# Patient Record
Sex: Male | Born: 1985 | Race: White | Hispanic: No | Marital: Single | Smoking: Current every day smoker
Health system: Southern US, Community
[De-identification: ages and names within clinical notes are randomized; demographics above are authoritative.]

## PROBLEM LIST (undated history)

## (undated) DIAGNOSIS — R000 Tachycardia, unspecified: Secondary | ICD-10-CM

## (undated) HISTORY — DX: Tachycardia, unspecified: R00.0

---
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
## 2011-06-27 ENCOUNTER — Ambulatory Visit: Payer: Self-pay

## 2012-02-17 IMAGING — US US PELVIS LIMITED
1 series · 18 of 25 positions shown · non-contrast
Comparison: none

REASON FOR EXAM: CR [id number] left sided pain and swelling
COMMENTS:

PROCEDURE:     US  - US TESTICULAR  - June 27, 2011  [DATE]
RESULT:     Right testicle measures 3.9 cm. Left testicle measures 4 cm.
Normal bilateral testicular flow is present. No evidence of testicular mass.
Mild left epididymis enlargement noted.

[Series 1: us pelvis limited · 18 of 48 slices shown]
[im 1/48]
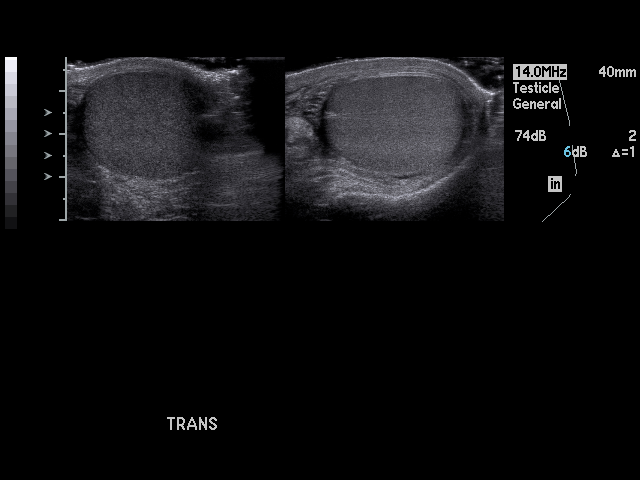
[im 4/48]
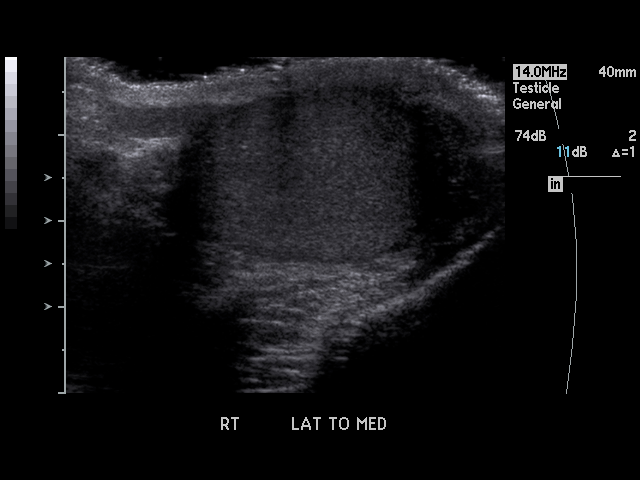
[im 6/48]
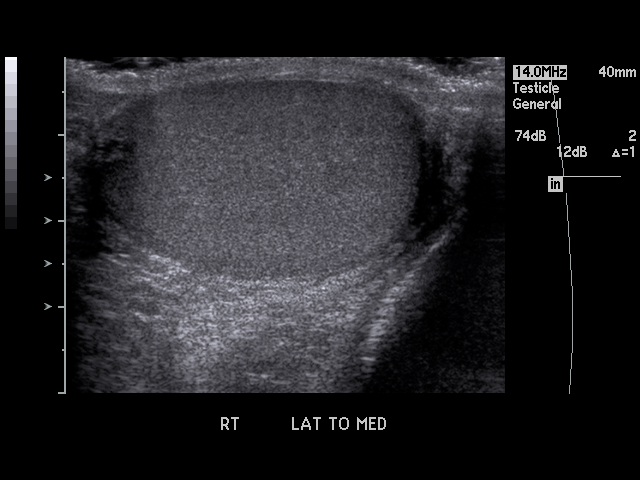
[im 8/48]
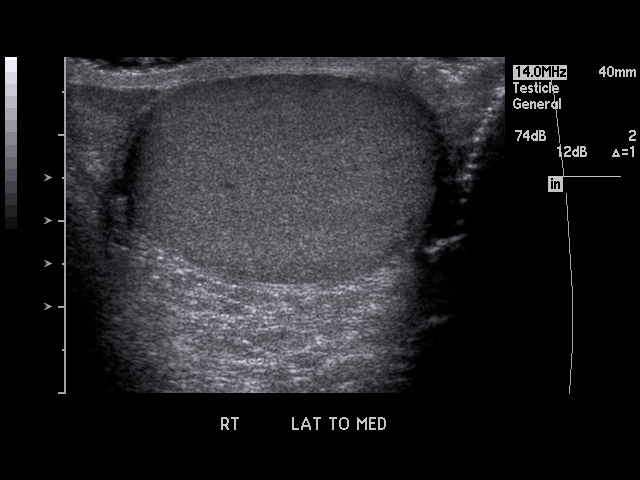
[im 12/48]
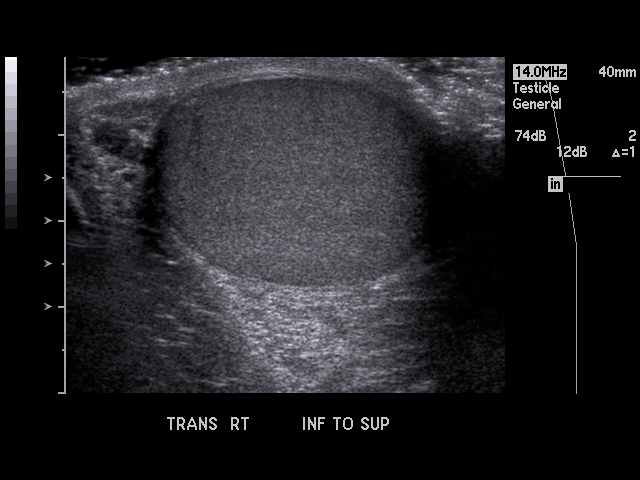
[im 14/48]
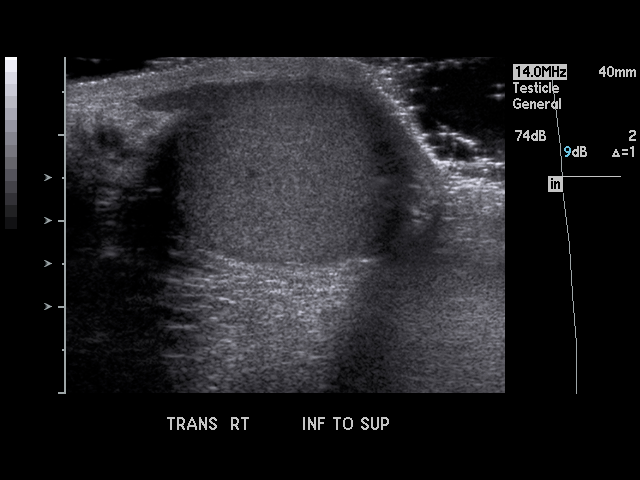
[im 18/48]
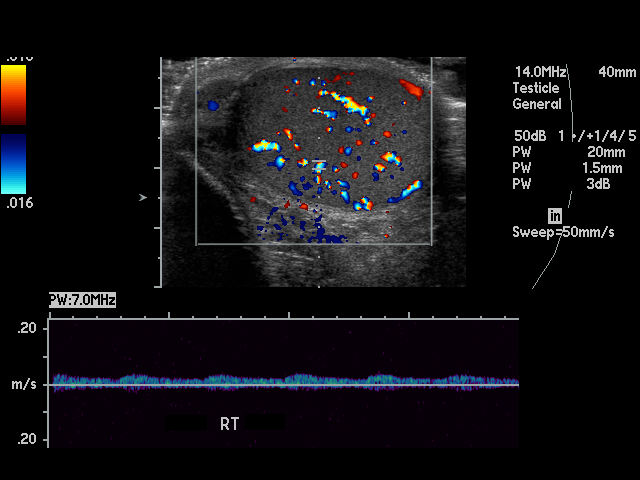
[im 20/48]
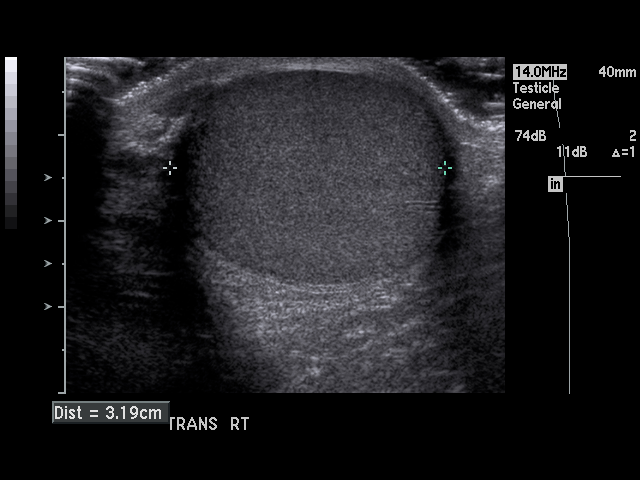
[im 22/48]
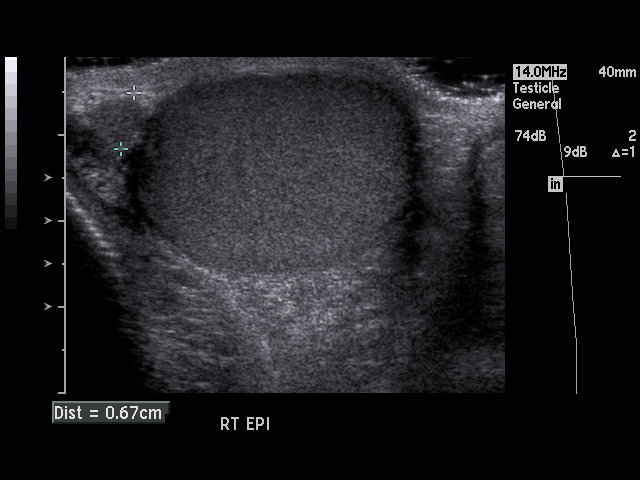
[im 26/48]
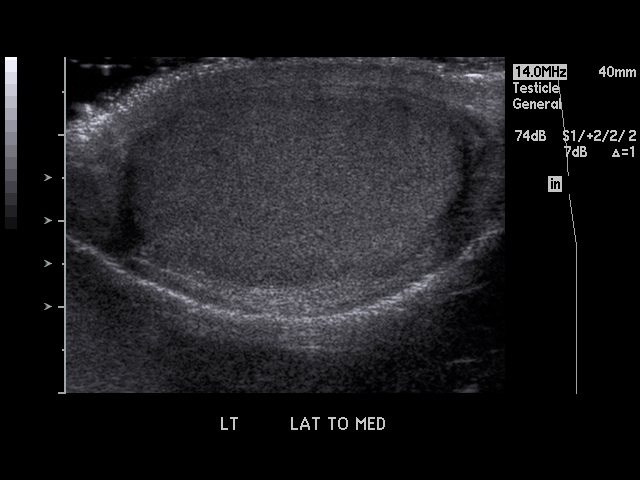
[im 28/48]
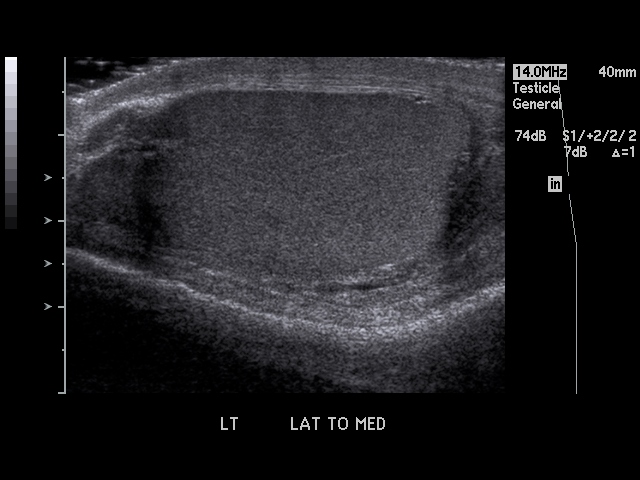
[im 30/48]
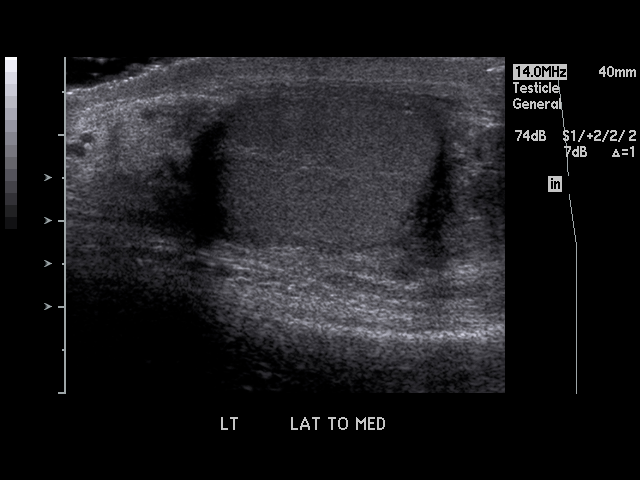
[im 34/48]
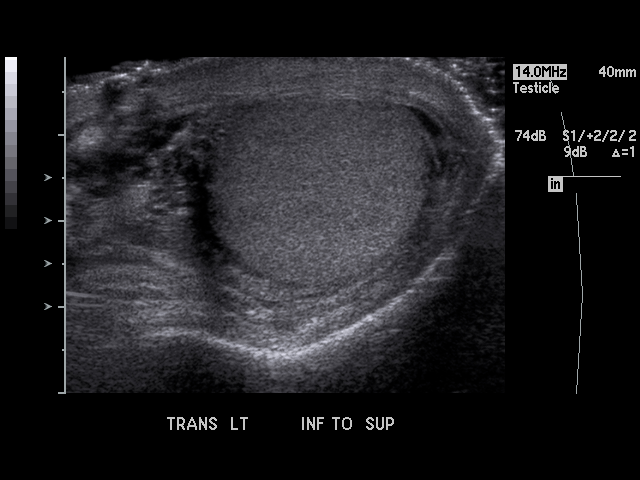
[im 36/48]
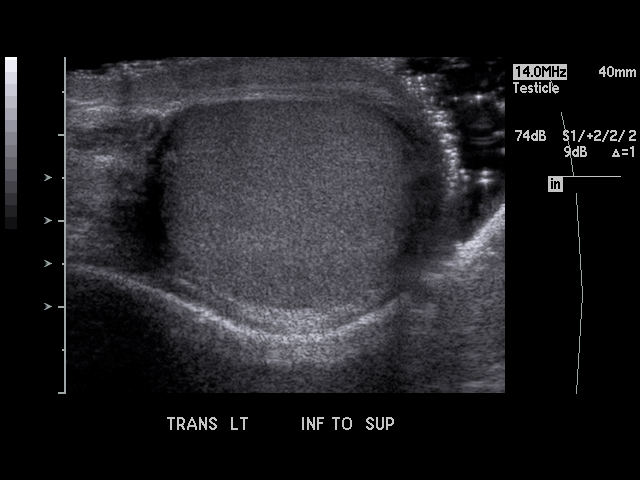
[im 40/48]
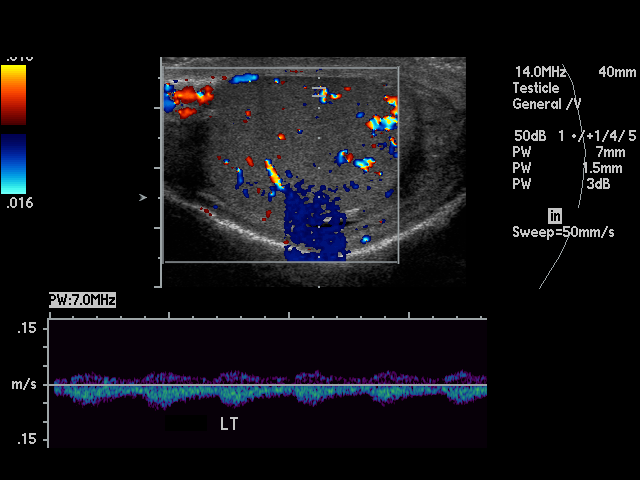
[im 42/48]
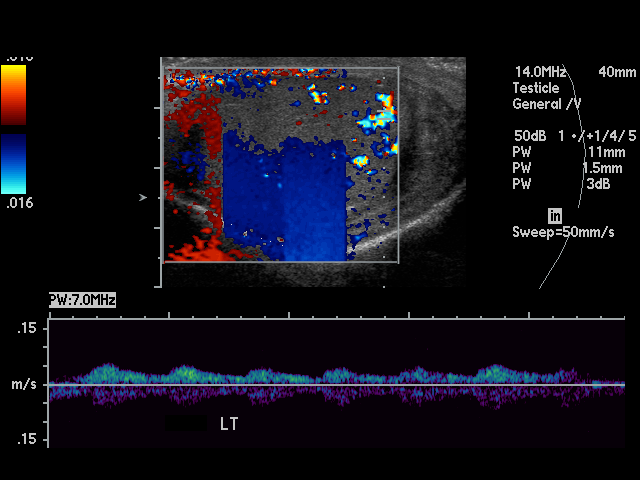
[im 44/48]
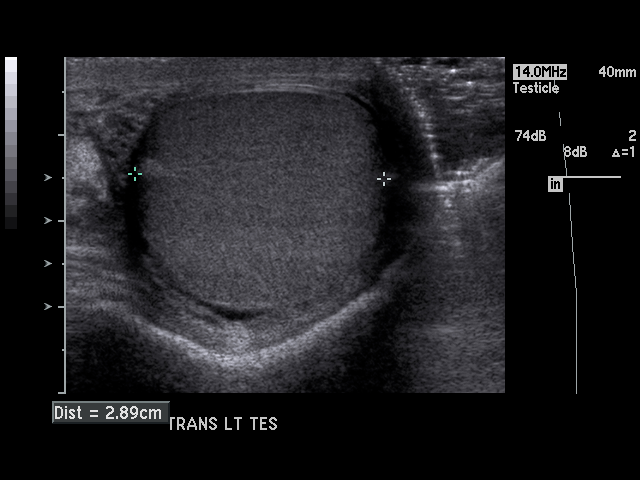
[im 48/48]
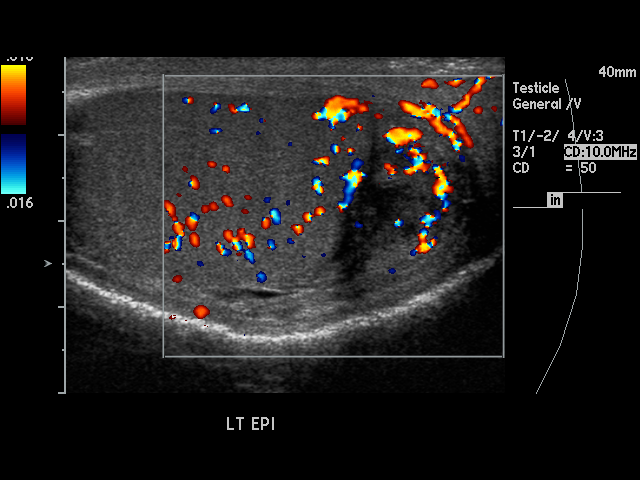

[18 of 25 positions shown; findings below may reference images not displayed]

IMPRESSION: Findings suggesting left epididymitis.

## 2021-06-14 ENCOUNTER — Other Ambulatory Visit: Payer: Self-pay

## 2021-06-14 ENCOUNTER — Encounter: Payer: Self-pay | Admitting: Family Medicine

## 2021-06-14 ENCOUNTER — Ambulatory Visit (INDEPENDENT_AMBULATORY_CARE_PROVIDER_SITE_OTHER): Payer: Self-pay | Admitting: Family Medicine

## 2021-06-14 VITALS — BP 118/88 | HR 75 | Temp 97.8°F | Ht 76.0 in | Wt 261.0 lb

## 2021-06-14 DIAGNOSIS — Z13220 Encounter for screening for lipoid disorders: Secondary | ICD-10-CM

## 2021-06-14 DIAGNOSIS — R7989 Other specified abnormal findings of blood chemistry: Secondary | ICD-10-CM

## 2021-06-14 DIAGNOSIS — Z0000 Encounter for general adult medical examination without abnormal findings: Secondary | ICD-10-CM

## 2021-06-14 NOTE — Progress Notes (Signed)
Annual Physical Exam Visit  Patient Information:  Patient ID: Caleb Marks, male DOB: Sep 21, 1986 Age: 35 y.o. MRN: [id number]   Subjective:   CC: Annual Physical Exam  HPI:  Caleb Marks is here for their annual physical.  I reviewed the past medical history, family history, social history, surgical history, and allergies today and changes were made as necessary.  Please see the problem list section below for additional details.  Past Medical History: Past Medical History:  Diagnosis Date   Tachycardia    Past Surgical History: History reviewed. No pertinent surgical history. Social History: Social History   Socioeconomic History   Marital status: Single    Spouse name: Not on file   Number of children: 0   Years of education: Not on file   Highest education level: Not on file  Occupational History   Not on file  Tobacco Use   Smoking status: Every Day    Packs/day: 1.00    Years: 14.00    Pack years: 14.00    Types: Cigarettes   Smokeless tobacco: Never  Vaping Use   Vaping Use: Some days  Substance and Sexual Activity   Alcohol use: Yes   Drug use: Yes    Types: Marijuana   Sexual activity: Yes  Other Topics Concern   Not on file  Social History Narrative   Not on file   Social Determinants of Health   Financial Resource Strain: Not on file  Food Insecurity: Not on file  Transportation Needs: Not on file  Physical Activity: Not on file  Stress: Not on file  Social Connections: Not on file   Family History: Family History  Problem Relation Age of Onset   Prostate cancer Father    Allergies: Not on File Health Maintenance: Health Maintenance  Topic Date Due   COVID-19 Vaccine (1) Never done   Pneumococcal Vaccine 78-9 Years old (1 - PCV) Never done   INFLUENZA VACCINE  06/10/2021   Hepatitis C Screening  06/14/2021 (Originally 08/22/2006)   HIV Screening  06/14/2021 (Originally 08/23/2003)   TETANUS/TDAP  11/10/2026   HPV VACCINES   Aged Out    HM Colonoscopy     This patient has no relevant Health Maintenance data.      Medications: No current outpatient medications on file prior to visit.   No current facility-administered medications on file prior to visit.    Review of Systems: No headache, visual changes, nausea, vomiting, diarrhea, constipation, dizziness, abdominal pain, no polyuria, no polydipsia, skin rash, fevers, chills, night sweats, swollen lymph nodes, weight loss, chest pain, body aches, joint swelling, muscle aches, shortness of breath, mood changes, visual or auditory hallucinations reported.  Objective:   Vitals:   06/14/21 0855  BP: 118/88  Pulse: 75  Temp: 97.8 F (36.6 C)  SpO2: 99%   Vitals:   06/14/21 0855  Weight: 261 lb (118.4 kg)  Height: 6\' 4"  (1.93 m)   Body mass index is 31.77 kg/m.  General: Well Developed, well nourished, and in no acute distress.  Neuro: Alert and oriented x3, extra-ocular muscles intact, sensation grossly intact. Cranial nerves II through XII are intact, motor, sensory, and coordinative functions are all intact. HEENT: Normocephalic, atraumatic, pupils equal round reactive to light, neck supple, no masses, no lymphadenopathy, thyroid nonpalpable. Oropharynx, nasopharynx, external ear canals are unremarkable. Skin: Warm and dry, no rashes noted.  Cardiac: Regular rate and rhythm, no murmurs rubs or gallops. No peripheral edema. Pulses symmetric.  Respiratory: Clear to auscultation bilaterally. Not using accessory muscles, speaking in full sentences.  Abdominal: Soft, nontender, nondistended, positive bowel sounds, no masses, no organomegaly. Musculoskeletal: Shoulder, elbow, wrist, hip, knee, ankle stable, and with full range of motion.   Impression and Recommendations:   The patient was counselled, risk factors were discussed, and anticipatory guidance given.  Annual physical exam Annual physical exam completed, risk stratification labs ordered,  fasting glucose will be evaluated given his specific intake concerns, will follow results once obtained.  Anticipatory guidance provided, will return in 1 year for annual exam.  Orders & Medications No orders of the defined types were placed in this encounter.  Orders Placed This Encounter  Procedures   TSH Rfx on Abnormal to Free T4   CBC   Comprehensive metabolic panel   Lipid panel   VITAMIN D 25 Hydroxy (Vit-D Deficiency, Fractures)     Return in about 1 year (around 06/14/2022).    Jerrol Banana, MD   Primary Care Sports Medicine [facility] [place]

## 2021-06-14 NOTE — Patient Instructions (Signed)
-   Obtain fasting labs once able to do so - Monitor for any "acetone breath" and timing with recent meals - Return in 1 year

## 2021-06-14 NOTE — Assessment & Plan Note (Signed)
Annual physical exam completed, risk stratification labs ordered, fasting glucose will be evaluated given his specific intake concerns, will follow results once obtained.  Anticipatory guidance provided, will return in 1 year for annual exam.
